# Patient Record
Sex: Male | Born: 2000 | Race: Black or African American | Hispanic: No | Marital: Single | Smoking: Never smoker
Health system: Southern US, Community
[De-identification: ages and names within clinical notes are randomized; demographics above are authoritative.]

## PROBLEM LIST (undated history)

## (undated) HISTORY — PX: TONSILLECTOMY: SUR1361

---
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
## 2022-01-01 ENCOUNTER — Ambulatory Visit: Payer: Medicaid Other | Admitting: Physical Therapy

## 2022-01-02 ENCOUNTER — Ambulatory Visit: Payer: Medicaid Other | Admitting: Physical Therapy

## 2022-01-03 ENCOUNTER — Ambulatory Visit: Payer: Medicaid Other | Admitting: Physical Therapy

## 2022-01-07 ENCOUNTER — Ambulatory Visit: Payer: Medicaid Other | Admitting: Physical Therapy

## 2022-01-08 ENCOUNTER — Ambulatory Visit: Payer: Medicaid Other | Admitting: Physical Therapy

## 2022-01-09 ENCOUNTER — Ambulatory Visit: Payer: Medicaid Other | Admitting: Physical Therapy

## 2022-01-10 ENCOUNTER — Encounter: Payer: Medicaid Other | Admitting: Physical Therapy

## 2022-01-14 ENCOUNTER — Ambulatory Visit: Payer: Medicaid Other | Admitting: Physical Therapy

## 2022-01-15 ENCOUNTER — Ambulatory Visit: Payer: Medicaid Other | Admitting: Physical Therapy

## 2022-01-16 ENCOUNTER — Ambulatory Visit: Payer: Medicaid Other | Admitting: Physical Therapy

## 2022-01-17 ENCOUNTER — Ambulatory Visit: Payer: Medicaid Other | Admitting: Physical Therapy

## 2022-01-21 ENCOUNTER — Ambulatory Visit: Payer: Medicaid Other | Admitting: Physical Therapy

## 2022-01-22 ENCOUNTER — Ambulatory Visit: Payer: Medicaid Other | Admitting: Physical Therapy

## 2022-01-23 ENCOUNTER — Ambulatory Visit: Payer: Medicaid Other | Admitting: Physical Therapy

## 2022-01-24 ENCOUNTER — Ambulatory Visit: Payer: Medicaid Other | Admitting: Physical Therapy

## 2022-01-28 ENCOUNTER — Ambulatory Visit: Payer: Medicaid Other | Admitting: Physical Therapy

## 2022-01-29 ENCOUNTER — Ambulatory Visit: Payer: Medicaid Other | Admitting: Orthopaedic Surgery

## 2022-01-29 ENCOUNTER — Ambulatory Visit: Payer: Medicaid Other | Admitting: Physical Therapy

## 2022-01-30 ENCOUNTER — Ambulatory Visit: Payer: Medicaid Other | Admitting: Physical Therapy

## 2022-01-31 ENCOUNTER — Ambulatory Visit: Payer: Medicaid Other | Admitting: Physical Therapy

## 2022-02-04 ENCOUNTER — Ambulatory Visit: Payer: Medicaid Other | Admitting: Physical Therapy

## 2022-02-05 ENCOUNTER — Ambulatory Visit: Payer: Medicaid Other | Admitting: Physical Therapy

## 2022-02-06 ENCOUNTER — Ambulatory Visit: Payer: Medicaid Other | Admitting: Physical Therapy

## 2022-02-07 ENCOUNTER — Ambulatory Visit: Payer: Medicaid Other | Admitting: Physical Therapy

## 2022-02-11 ENCOUNTER — Ambulatory Visit: Payer: Medicaid Other | Admitting: Physical Therapy

## 2022-02-12 ENCOUNTER — Ambulatory Visit: Payer: Medicaid Other | Admitting: Physical Therapy

## 2022-02-12 ENCOUNTER — Ambulatory Visit: Payer: Medicaid Other | Admitting: Orthopaedic Surgery

## 2022-02-13 ENCOUNTER — Ambulatory Visit: Payer: Medicaid Other | Admitting: Physical Therapy

## 2022-02-14 ENCOUNTER — Ambulatory Visit: Payer: Medicaid Other | Admitting: Physical Therapy

## 2022-05-22 ENCOUNTER — Ambulatory Visit (INDEPENDENT_AMBULATORY_CARE_PROVIDER_SITE_OTHER): Payer: Medicaid Other | Admitting: Orthopaedic Surgery

## 2022-05-22 ENCOUNTER — Ambulatory Visit (INDEPENDENT_AMBULATORY_CARE_PROVIDER_SITE_OTHER): Payer: Medicaid Other

## 2022-05-22 DIAGNOSIS — S72352D Displaced comminuted fracture of shaft of left femur, subsequent encounter for closed fracture with routine healing: Secondary | ICD-10-CM

## 2022-05-22 NOTE — Progress Notes (Signed)
   Post-Op Visit Note   Patient: Andre Castillo           Date of Birth: [birth year]           MRN: [id number] Visit Date: 05/22/2022 PCP: Pcp, No   Assessment & Plan:  Chief Complaint:  Chief Complaint  Patient presents with   Left Leg - Routine Post Op   Visit Diagnoses:  1. Closed displaced comminuted fracture of shaft of left femur with routine healing, subsequent encounter     Plan: Patient is a pleasant 21 year old who comes in today for follow-up of his left femur fracture.  He is approximately 8 and half months status post left retrograde femoral nail 09/07/2021.  He continues to have pain to the left hip and knee.  He is also complaining of limited range of motion of the left knee.  He notes that he stopped going to physical therapy back in early 2022-12-10 due to a death in the family.  He has not been working on his exercise program.  Examination of his left hip reveals a painless logroll.  Left knee exam shows range of motion from 0 to 95 degrees.  He does have tenderness to the medial knee just over the screw.  He is neurovascular intact distally.  At this point, his fracture has healed.  He would like to have the hardware removed specifically the distal interlock screws.  We will have Debbie call him to schedule this.  In the meantime, we will make referral to Dr. Steward Drone for left hip intra-articular cortisone injection.  Follow-Up Instructions: Return for post-op.   Orders:  Orders Placed This Encounter  Procedures   XR FEMUR MIN 2 VIEWS LEFT   No orders of the defined types were placed in this encounter.   Imaging: XR FEMUR MIN 2 VIEWS LEFT  Result Date: 05/22/2022 X-rays demonstrate a healed distal femur fracture.  No hardware complication.   PMFS History: Patient Active Problem List   Diagnosis Date Noted   Left leg pain 09/12/2021   Swelling of thigh    Closed displaced comminuted fracture of shaft of left femur (HCC) 09/07/2021   No past medical history on  file.  No family history on file.  Past Surgical History:  Procedure Laterality Date   FEMUR IM NAIL Left 09/07/2021   Procedure: LEFT INTRAMEDULLARY (IM) RETROGRADE FEMORAL NAILING;  Surgeon: Tarry Kos, MD;  Location: [facility] OR;  Service: Orthopedics;  Laterality: Left;   Social History   Occupational History   Not on file  Tobacco Use   Smoking status: Not on file   Smokeless tobacco: Not on file  Substance and Sexual Activity   Alcohol use: Not on file   Drug use: Not on file   Sexual activity: Not on file

## 2022-05-23 ENCOUNTER — Encounter: Payer: Self-pay | Admitting: Family Medicine

## 2022-06-11 ENCOUNTER — Other Ambulatory Visit: Payer: Self-pay | Admitting: Physician Assistant

## 2022-06-11 MED ORDER — HYDROCODONE-ACETAMINOPHEN 5-325 MG PO TABS: 1.0000 | ORAL_TABLET | Freq: Two times a day (BID) | ORAL | 0 refills | Status: DC | PRN

## 2022-06-11 MED ORDER — ONDANSETRON HCL 4 MG PO TABS: 4.0000 mg | ORAL_TABLET | Freq: Three times a day (TID) | ORAL | 0 refills | Status: DC | PRN

## 2022-06-20 ENCOUNTER — Other Ambulatory Visit: Payer: Self-pay | Admitting: Orthopaedic Surgery

## 2022-06-20 DIAGNOSIS — T8484XA Pain due to internal orthopedic prosthetic devices, implants and grafts, initial encounter: Secondary | ICD-10-CM | POA: Diagnosis not present

## 2022-06-20 MED ORDER — TRAMADOL HCL 50 MG PO TABS: 50.0000 mg | ORAL_TABLET | Freq: Every day | ORAL | 0 refills | Status: AC | PRN

## 2022-07-04 ENCOUNTER — Encounter: Payer: Medicaid Other | Admitting: Orthopaedic Surgery

## 2022-07-18 ENCOUNTER — Ambulatory Visit (INDEPENDENT_AMBULATORY_CARE_PROVIDER_SITE_OTHER): Payer: Medicaid Other | Admitting: Physician Assistant

## 2022-07-18 ENCOUNTER — Other Ambulatory Visit: Payer: Self-pay | Admitting: Physician Assistant

## 2022-07-18 DIAGNOSIS — Z9889 Other specified postprocedural states: Secondary | ICD-10-CM

## 2022-07-18 MED ORDER — DICLOFENAC SODIUM 75 MG PO TBEC: 75.0000 mg | DELAYED_RELEASE_TABLET | Freq: Two times a day (BID) | ORAL | 2 refills | Status: AC | PRN

## 2022-07-18 NOTE — Progress Notes (Signed)
   Post-Op Visit Note   Patient: Andre Castillo           Date of Birth: May 28, 2001           MRN: [id number] Visit Date: 07/18/2022 PCP: Pcp, No   Assessment & Plan:  Chief Complaint:  Chief Complaint  Patient presents with   Left Knee - Routine Post Op   Visit Diagnoses:  1. Status post hardware removal     Plan: Patient is an 21 year old who comes in today for his first postoperative visit which is 4 weeks status post removal of distal interlock screws from previous retrograde femoral nail.  He notes mild discomfort to the knee which is unchanged prior to the surgery.  He is not taking anything for pain.  Examination of his left knee reveals a small effusion.  Well-healed surgical incision with nylon sutures in place.  No evidence of infection or cellulitis.  Painless range of motion.  He is neurovascular intact distally.  Today, sutures removed and Steri-Strips applied.  We will continue to advance activity as tolerated.  I sent in diclofenac to take as needed.  Follow-up as needed.  Call with concerns or questions.  Follow-Up Instructions: Return if symptoms worsen or fail to improve.   Orders:  No orders of the defined types were placed in this encounter.  No orders of the defined types were placed in this encounter.   Imaging: No new imaging  PMFS History: Patient Active Problem List   Diagnosis Date Noted   Left leg pain 09/12/2021   Swelling of thigh    Closed displaced comminuted fracture of shaft of left femur ([place]) 09/07/2021   No past medical history on file.  No family history on file.  Past Surgical History:  Procedure Laterality Date   FEMUR IM NAIL Left 09/07/2021   Procedure: LEFT INTRAMEDULLARY (IM) RETROGRADE FEMORAL NAILING;  Surgeon: Leandrew Koyanagi, MD;  Location: [facility];  Service: Orthopedics;  Laterality: Left;   Social History   Occupational History   Not on file  Tobacco Use   Smoking status: Not on file   Smokeless tobacco: Not on file   Substance and Sexual Activity   Alcohol use: Not on file   Drug use: Not on file   Sexual activity: Not on file

## 2022-12-15 ENCOUNTER — Emergency Department (HOSPITAL_COMMUNITY)
Admission: EM | Admit: 2022-12-15 | Discharge: 2022-12-15 | Disposition: A | Discharge status: Home / Self Care | Payer: Medicaid Other | Attending: Emergency Medicine | Admitting: Emergency Medicine

## 2022-12-15 ENCOUNTER — Emergency Department (HOSPITAL_COMMUNITY): Payer: Medicaid Other

## 2022-12-15 DIAGNOSIS — M25552 Pain in left hip: Secondary | ICD-10-CM

## 2022-12-15 MED ORDER — IBUPROFEN 800 MG PO TABS
800.0000 mg | ORAL_TABLET | Freq: Once | ORAL | Status: AC
  Administered 2022-12-15: 800 mg via ORAL
  Filled 2022-12-15: qty 1

## 2022-12-15 MED ORDER — CYCLOBENZAPRINE HCL 10 MG PO TABS: 10.0000 mg | ORAL_TABLET | Freq: Two times a day (BID) | ORAL | 0 refills | Status: DC | PRN

## 2022-12-15 MED ORDER — CYCLOBENZAPRINE HCL 10 MG PO TABS: 10.0000 mg | ORAL_TABLET | Freq: Two times a day (BID) | ORAL | 0 refills | Status: AC | PRN

## 2022-12-15 MED ORDER — IBUPROFEN 600 MG PO TABS: 600.0000 mg | ORAL_TABLET | Freq: Four times a day (QID) | ORAL | 0 refills | Status: AC | PRN

## 2022-12-15 MED ORDER — IBUPROFEN 600 MG PO TABS: 600.0000 mg | ORAL_TABLET | Freq: Four times a day (QID) | ORAL | 0 refills | Status: DC | PRN
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
## 2022-12-15 NOTE — Discharge Instructions (Addendum)
You have been evaluated for your hip pain.  Fortunately x-ray did not show any new broken bone or loosening of the hardware.  Please follow-up closely with orthopedics for outpatient evaluation and management of your condition.  Take medication prescribed as needed for symptom control.
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
## 2022-12-19 ENCOUNTER — Encounter: Payer: Self-pay | Admitting: Orthopaedic Surgery

## 2022-12-19 ENCOUNTER — Telehealth: Payer: Self-pay | Admitting: Orthopaedic Surgery

## 2022-12-19 ENCOUNTER — Ambulatory Visit (INDEPENDENT_AMBULATORY_CARE_PROVIDER_SITE_OTHER): Payer: Medicaid Other | Admitting: Orthopaedic Surgery

## 2022-12-19 DIAGNOSIS — S72352D Displaced comminuted fracture of shaft of left femur, subsequent encounter for closed fracture with routine healing: Secondary | ICD-10-CM

## 2022-12-19 NOTE — Telephone Encounter (Signed)
Patient asking to have hardware removed from his leg. Please advise

## 2022-12-19 NOTE — Progress Notes (Signed)
Office Visit Note   Patient: Andre Castillo           Date of Birth: [birth year]           MRN: [id number] Visit Date: 12/19/2022              Requested by: No referring provider defined for this encounter. PCP: Pcp, No   Assessment & Plan: Visit Diagnoses:  1. Closed displaced comminuted fracture of shaft of left femur with routine healing, subsequent encounter     Plan: Impression is left lateral hip pain with contusion from the new fall.  Reassurance provided that there is no hardware complications that could explain his pain.  I explained the proximal interlocking screws go from anterior to posterior and not medial to lateral.  He also reports exercise intolerance and lack of energy.  I recommend that he see a primary care doctor for a full evaluation.  He also reports left knee stiffness which is consistent with expected postsurgical changes.  We have made a referral to physical therapy to hopefully help with strength and exercise tolerance.  I also explained that I am more than happy to take out all the hardware if he needs this for his mental wellbeing but I cautioned him that this will probably not improve the symptoms and will weaken the femur temporarily.  He will follow-up as needed.  Total face to face encounter time was greater than 25 minutes and over half of this time was spent in counseling and/or coordination of care.  Follow-Up Instructions: Return if symptoms worsen or fail to improve.   Orders:  Orders Placed This Encounter  Procedures   Ambulatory referral to Physical Therapy   No orders of the defined types were placed in this encounter.     Procedures: No procedures performed   Clinical Data: No additional findings.   Subjective: Chief Complaint  Patient presents with   Left Leg - Pain    HPI patient is a pleasant 22 year old who comes in today with left lateral hip pain.  He is status post retrograde femoral nail 09/07/2021 with subsequent  hardware removal to the distal interlock screws from this past fall.  He is doing well in regards to the knee but has had chronic discomfort to the lateral hip.  This became worse after sustaining a fall a few weeks ago.  He notes that he slipped on ice and fell in the left buttock and lateral hip.  The pain since the fall has slightly improved but it is still there.  Symptoms are worse when he is lying on the left side as well as after he has been walking a lot throughout the day at school.  He does not currently have any pain sitting on the table.  He also has relief with taking a muscle relaxer.  Review of Systems as detailed in HPI.  All others reviewed and are negative.   Objective: Vital Signs: There were no vitals taken for this visit.  Physical Exam well-developed well-nourished gentleman in no acute distress.  Alert and oriented x 3.  Ortho Exam left hip exam reveals painless logroll.  Moderate tenderness over the greater trochanter.  Painless range of motion of the knee.  He is neurovascular intact distally.  Specialty Comments:  No specialty comments available.  Imaging: No new imaging   PMFS History: Patient Active Problem List   Diagnosis Date Noted   Left leg pain 09/12/2021   Swelling of thigh  Closed displaced comminuted fracture of shaft of left femur ([place]) 09/07/2021   History reviewed. No pertinent past medical history.  No family history on file.  Past Surgical History:  Procedure Laterality Date   FEMUR IM NAIL Left 09/07/2021   Procedure: LEFT INTRAMEDULLARY (IM) RETROGRADE FEMORAL NAILING;  Surgeon: Leandrew Koyanagi, MD;  Location: [place];  Service: Orthopedics;  Laterality: Left;   Social History   Occupational History   Not on file  Tobacco Use   Smoking status: Not on file   Smokeless tobacco: Not on file  Substance and Sexual Activity   Alcohol use: Not on file   Drug use: Not on file   Sexual activity: Not on file

## 2022-12-20 NOTE — Telephone Encounter (Signed)
Ok, I'll let debbie know

## 2023-01-02 ENCOUNTER — Encounter (HOSPITAL_COMMUNITY): Payer: Self-pay | Admitting: Orthopaedic Surgery

## 2023-01-02 NOTE — Pre-Procedure Instructions (Signed)
PCP - Pt does not remember who it is Cardiologist - n/a  PPM/ICD - Denies Device Orders - n/a Rep Notified - n/a  Chest x-ray - n/a EKG - Denies Stress Test - Denies ECHO - Denies Cardiac Cath - Denies  CPAP - n/a  No DM  Blood Thinner Instructions: n/a Aspirin Instructions: n/a  ERAS Protcol - Clear liquids until 0430 morning of surgery  COVID TEST- n/a  Anesthesia review: No.  Patient verbally denies any shortness of breath, fever, cough and chest pain during phone call   -------------  SDW INSTRUCTIONS given:  Your procedure is scheduled on January 03, 2023.  Report to [facility] Main Entrance "A" at 5:30 A.M., and check in at the Admitting office.  Call this number if you have problems the morning of surgery:  [phone]   Remember:  Do not eat after midnight the night before your surgery  You may drink clear liquids until 4:30 AM the morning of your surgery.   Clear liquids allowed are: Water, Non-Citrus Juices (without pulp), Carbonated Beverages, Clear Tea, Black Coffee Only, and Gatorade    Take these medicines the morning of surgery with A SIP OF WATER:  NONE  As of today, STOP taking any Aspirin (unless otherwise instructed by your surgeon) Aleve, Naproxen, Ibuprofen, Motrin, Advil, Goody's, BC's, all herbal medications, fish oil, and all vitamins.                      Do not wear jewelry, make up, or nail polish            Do not wear lotions, powders, perfumes/colognes, or deodorant.            Do not shave 48 hours prior to surgery.  Men may shave face and neck.            Do not bring valuables to the hospital.            [facility] is not responsible for any belongings or valuables.  Do NOT Smoke (Tobacco/Vaping) 24 hours prior to your procedure If you use a CPAP at night, you may bring all equipment for your overnight stay.   Contacts, glasses, dentures or bridgework may not be worn into surgery.      For patients admitted to the hospital,  discharge time will be determined by your treatment team.   Patients discharged the day of surgery will not be allowed to drive home, and someone needs to stay with them for 24 hours.    Special instructions:   [place]- Preparing For Surgery  Before surgery, you can play an important role. Because skin is not sterile, your skin needs to be as free of germs as possible. You can reduce the number of germs on your skin by washing with CHG (chlorahexidine gluconate) Soap before surgery.  CHG is an antiseptic cleaner which kills germs and bonds with the skin to continue killing germs even after washing.    Oral Hygiene is also important to reduce your risk of infection.  Remember - BRUSH YOUR TEETH THE MORNING OF SURGERY WITH YOUR REGULAR TOOTHPASTE  Please do not use if you have an allergy to CHG or antibacterial soaps. If your skin becomes reddened/irritated stop using the CHG.  Do not shave (including legs and underarms) for at least 48 hours prior to first CHG shower. It is OK to shave your face.  Please follow these instructions carefully.   Shower the [employer]  BEFORE SURGERY and the MORNING OF SURGERY with DIAL Soap.   Pat yourself dry with a CLEAN TOWEL.  Wear CLEAN PAJAMAS to bed the night before surgery  Place CLEAN SHEETS on your bed the night of your first shower and DO NOT SLEEP WITH PETS.   Day of Surgery: Please shower morning of surgery  Wear Clean/Comfortable clothing the morning of surgery Do not apply any deodorants/lotions.   Remember to brush your teeth WITH YOUR REGULAR TOOTHPASTE.   Questions were answered. Patient verbalized understanding of instructions.

## 2023-01-02 NOTE — Anesthesia Preprocedure Evaluation (Addendum)
Anesthesia Evaluation  Patient identified by MRN, date of birth, ID band Patient awake    Reviewed: Allergy & Precautions, NPO status , Patient's Chart, lab work & pertinent test results  History of Anesthesia Complications Negative for: history of anesthetic complications  Airway Mallampati: I  TM Distance: >3 FB Neck ROM: Full    Dental  (+) Dental Advisory Given, Teeth Intact   Pulmonary Current Smoker and Patient abstained from smoking.   breath sounds clear to auscultation       Cardiovascular negative cardio ROS  Rhythm:Regular Rate:Normal     Neuro/Psych negative neurological ROS     GI/Hepatic negative GI ROS, Neg liver ROS,,,  Endo/Other  negative endocrine ROS    Renal/GU negative Renal ROS     Musculoskeletal   Abdominal   Peds  Hematology negative hematology ROS (+)   Anesthesia Other Findings   Reproductive/Obstetrics                              Anesthesia Physical Anesthesia Plan  ASA: 2  Anesthesia Plan: General   Post-op Pain Management: Tylenol PO (pre-op)*   Induction: Intravenous  PONV Risk Score and Plan: 2 and Ondansetron and Dexamethasone  Airway Management Planned: LMA  Additional Equipment:   Intra-op Plan:   Post-operative Plan:   Informed Consent: I have reviewed the patients History and Physical, chart, labs and discussed the procedure including the risks, benefits and alternatives for the proposed anesthesia with the patient or authorized representative who has indicated his/her understanding and acceptance.     Dental advisory given  Plan Discussed with: CRNA and Surgeon  Anesthesia Plan Comments:          Anesthesia Quick Evaluation

## 2023-01-03 ENCOUNTER — Encounter (HOSPITAL_COMMUNITY)
Admission: RE | Disposition: A | Discharge status: Home / Self Care | Payer: Self-pay | Source: Home / Self Care | Attending: Orthopaedic Surgery

## 2023-01-03 ENCOUNTER — Encounter (HOSPITAL_COMMUNITY): Payer: Self-pay | Admitting: Orthopaedic Surgery

## 2023-01-03 ENCOUNTER — Ambulatory Visit (HOSPITAL_COMMUNITY): Payer: Medicaid Other | Admitting: Anesthesiology

## 2023-01-03 ENCOUNTER — Ambulatory Visit (HOSPITAL_COMMUNITY): Payer: Medicaid Other

## 2023-01-03 ENCOUNTER — Other Ambulatory Visit: Payer: Self-pay

## 2023-01-03 ENCOUNTER — Ambulatory Visit (HOSPITAL_BASED_OUTPATIENT_CLINIC_OR_DEPARTMENT_OTHER): Payer: Medicaid Other | Admitting: Anesthesiology

## 2023-01-03 ENCOUNTER — Ambulatory Visit (HOSPITAL_COMMUNITY)
Admission: RE | Admit: 2023-01-03 | Discharge: 2023-01-03 | Disposition: A | Discharge status: Home / Self Care | Payer: Medicaid Other | Attending: Orthopaedic Surgery | Admitting: Orthopaedic Surgery

## 2023-01-03 DIAGNOSIS — T8484XA Pain due to internal orthopedic prosthetic devices, implants and grafts, initial encounter: Secondary | ICD-10-CM

## 2023-01-03 DIAGNOSIS — Z472 Encounter for removal of internal fixation device: Secondary | ICD-10-CM | POA: Insufficient documentation

## 2023-01-03 DIAGNOSIS — S72352D Displaced comminuted fracture of shaft of left femur, subsequent encounter for closed fracture with routine healing: Secondary | ICD-10-CM | POA: Diagnosis not present

## 2023-01-03 DIAGNOSIS — S72352A Displaced comminuted fracture of shaft of left femur, initial encounter for closed fracture: Secondary | ICD-10-CM | POA: Diagnosis present

## 2023-01-03 HISTORY — PX: HARDWARE REMOVAL: SHX979

## 2023-01-03 LAB — CBC
HCT: 42.7 % (ref 39.0–52.0)
Hemoglobin: 14.9 g/dL (ref 13.0–17.0)
MCH: 32 pg (ref 26.0–34.0)
MCHC: 34.9 g/dL (ref 30.0–36.0)
MCV: 91.8 fL (ref 80.0–100.0)
Platelets: 260 10*3/uL (ref 150–400)
RBC: 4.65 MIL/uL (ref 4.22–5.81)
RDW: 12.6 % (ref 11.5–15.5)
WBC: 7.7 10*3/uL (ref 4.0–10.5)
nRBC: 0 % (ref 0.0–0.2)

## 2023-01-03 SURGERY — REMOVAL, HARDWARE
Anesthesia: General | Site: Leg Upper | Laterality: Left

## 2023-01-03 MED ORDER — OXYCODONE HCL 5 MG/5ML PO SOLN: 5.0000 mg | Freq: Once | ORAL | Status: DC | PRN

## 2023-01-03 MED ORDER — PROMETHAZINE HCL 25 MG/ML IJ SOLN: 6.2500 mg | INTRAMUSCULAR | Status: DC | PRN

## 2023-01-03 MED ORDER — BUPIVACAINE HCL (PF) 0.25 % IJ SOLN
INTRAMUSCULAR | Status: AC
  Filled 2023-01-03: qty 20

## 2023-01-03 MED ORDER — FENTANYL CITRATE (PF) 250 MCG/5ML IJ SOLN
INTRAMUSCULAR | Status: DC | PRN
  Administered 2023-01-03 (×2): 50 ug via INTRAVENOUS
  Administered 2023-01-03: 150 ug via INTRAVENOUS

## 2023-01-03 MED ORDER — ORAL CARE MOUTH RINSE: 15.0000 mL | Freq: Once | OROMUCOSAL | Status: AC

## 2023-01-03 MED ORDER — ONDANSETRON HCL 4 MG PO TABS: 4.0000 mg | ORAL_TABLET | Freq: Three times a day (TID) | ORAL | 0 refills | Status: DC | PRN

## 2023-01-03 MED ORDER — BUPIVACAINE HCL (PF) 0.25 % IJ SOLN
INTRAMUSCULAR | Status: AC
  Filled 2023-01-03: qty 10

## 2023-01-03 MED ORDER — DEXAMETHASONE SODIUM PHOSPHATE 10 MG/ML IJ SOLN
INTRAMUSCULAR | Status: DC | PRN
  Administered 2023-01-03: 5 mg via INTRAVENOUS

## 2023-01-03 MED ORDER — DEXMEDETOMIDINE HCL IN NACL 80 MCG/20ML IV SOLN
INTRAVENOUS | Status: DC | PRN
  Administered 2023-01-03: 6 ug via BUCCAL
  Administered 2023-01-03: 8 ug via BUCCAL

## 2023-01-03 MED ORDER — HYDROMORPHONE HCL 1 MG/ML IJ SOLN: 0.2500 mg | INTRAMUSCULAR | Status: DC | PRN

## 2023-01-03 MED ORDER — FENTANYL CITRATE (PF) 250 MCG/5ML IJ SOLN
INTRAMUSCULAR | Status: AC
  Filled 2023-01-03: qty 5

## 2023-01-03 MED ORDER — HYDROMORPHONE HCL 1 MG/ML IJ SOLN
INTRAMUSCULAR | Status: AC
  Filled 2023-01-03: qty 0.5

## 2023-01-03 MED ORDER — OXYCODONE-ACETAMINOPHEN 5-325 MG PO TABS: 1.0000 | ORAL_TABLET | Freq: Two times a day (BID) | ORAL | 0 refills | Status: AC | PRN

## 2023-01-03 MED ORDER — AMISULPRIDE (ANTIEMETIC) 5 MG/2ML IV SOLN
INTRAVENOUS | Status: AC
  Filled 2023-01-03: qty 4

## 2023-01-03 MED ORDER — LACTATED RINGERS IV SOLN: INTRAVENOUS | Status: DC

## 2023-01-03 MED ORDER — PROPOFOL 10 MG/ML IV BOLUS
INTRAVENOUS | Status: AC
  Filled 2023-01-03: qty 20

## 2023-01-03 MED ORDER — CHLORHEXIDINE GLUCONATE 0.12 % MT SOLN
15.0000 mL | Freq: Once | OROMUCOSAL | Status: AC
  Administered 2023-01-03: 15 mL via OROMUCOSAL
  Filled 2023-01-03: qty 15

## 2023-01-03 MED ORDER — OXYCODONE HCL 5 MG PO TABS: 5.0000 mg | ORAL_TABLET | Freq: Once | ORAL | Status: DC | PRN

## 2023-01-03 MED ORDER — LIDOCAINE 2% (20 MG/ML) 5 ML SYRINGE
INTRAMUSCULAR | Status: DC | PRN
  Administered 2023-01-03: 60 mg via INTRAVENOUS

## 2023-01-03 MED ORDER — ACETAMINOPHEN 500 MG PO TABS
1000.0000 mg | ORAL_TABLET | Freq: Once | ORAL | Status: AC
  Administered 2023-01-03: 1000 mg via ORAL
  Filled 2023-01-03: qty 2

## 2023-01-03 MED ORDER — CEFAZOLIN SODIUM-DEXTROSE 2-4 GM/100ML-% IV SOLN
2.0000 g | INTRAVENOUS | Status: AC
  Administered 2023-01-03: 2 g via INTRAVENOUS
  Filled 2023-01-03: qty 100

## 2023-01-03 MED ORDER — AMISULPRIDE (ANTIEMETIC) 5 MG/2ML IV SOLN
10.0000 mg | Freq: Once | INTRAVENOUS | Status: AC
  Administered 2023-01-03: 10 mg via INTRAVENOUS

## 2023-01-03 MED ORDER — MIDAZOLAM HCL 2 MG/2ML IJ SOLN
INTRAMUSCULAR | Status: AC
  Filled 2023-01-03: qty 2

## 2023-01-03 MED ORDER — METHOCARBAMOL 750 MG PO TABS: 750.0000 mg | ORAL_TABLET | Freq: Two times a day (BID) | ORAL | 3 refills | Status: AC | PRN

## 2023-01-03 MED ORDER — MIDAZOLAM HCL 2 MG/2ML IJ SOLN
INTRAMUSCULAR | Status: DC | PRN
  Administered 2023-01-03: 2 mg via INTRAVENOUS

## 2023-01-03 MED ORDER — MIDAZOLAM HCL 2 MG/2ML IJ SOLN: 0.5000 mg | Freq: Once | INTRAMUSCULAR | Status: DC | PRN

## 2023-01-03 MED ORDER — HYDROMORPHONE HCL 1 MG/ML IJ SOLN
INTRAMUSCULAR | Status: DC | PRN
  Administered 2023-01-03 (×2): .25 mg via INTRAVENOUS

## 2023-01-03 MED ORDER — BUPIVACAINE HCL (PF) 0.25 % IJ SOLN
INTRAMUSCULAR | Status: DC | PRN
  Administered 2023-01-03: 30 mL

## 2023-01-03 MED ORDER — MEPERIDINE HCL 25 MG/ML IJ SOLN: 6.2500 mg | INTRAMUSCULAR | Status: DC | PRN

## 2023-01-03 MED ORDER — PROPOFOL 10 MG/ML IV BOLUS
INTRAVENOUS | Status: DC | PRN
  Administered 2023-01-03: 250 mg via INTRAVENOUS

## 2023-01-03 MED ORDER — 0.9 % SODIUM CHLORIDE (POUR BTL) OPTIME
TOPICAL | Status: DC | PRN
  Administered 2023-01-03: 1000 mL

## 2023-01-03 MED ORDER — ONDANSETRON HCL 4 MG/2ML IJ SOLN
INTRAMUSCULAR | Status: DC | PRN
  Administered 2023-01-03: 4 mg via INTRAVENOUS

## 2023-01-03 MED ORDER — KETOROLAC TROMETHAMINE 10 MG PO TABS: 10.0000 mg | ORAL_TABLET | Freq: Two times a day (BID) | ORAL | 0 refills | Status: AC | PRN

## 2023-01-03 SURGICAL SUPPLY — 70 items
ALCOHOL 70% 16 OZ (MISCELLANEOUS) ×1 IMPLANT
BAG COUNTER SPONGE SURGICOUNT (BAG) IMPLANT
BAG SPNG CNTER NS LX DISP (BAG)
BLADE CLIPPER SURG (BLADE) IMPLANT
BLADE SURG 15 STRL LF DISP TIS (BLADE) ×1 IMPLANT
BLADE SURG 15 STRL SS (BLADE) ×1
BNDG CMPR MED 10X6 ELC LF (GAUZE/BANDAGES/DRESSINGS) ×1
BNDG ELASTIC 6X10 VLCR STRL LF (GAUZE/BANDAGES/DRESSINGS) ×1 IMPLANT
BNDG GAUZE DERMACEA FLUFF 4 (GAUZE/BANDAGES/DRESSINGS) ×1 IMPLANT
BNDG GZE DERMACEA 4 6PLY (GAUZE/BANDAGES/DRESSINGS) ×1
COVER SURGICAL LIGHT HANDLE (MISCELLANEOUS) ×1 IMPLANT
CUFF TOURN SGL QUICK 34 (TOURNIQUET CUFF)
CUFF TOURN SGL QUICK 42 (TOURNIQUET CUFF) IMPLANT
CUFF TRNQT CYL 34X4.125X (TOURNIQUET CUFF) IMPLANT
DRAPE C-ARM 42X72 X-RAY (DRAPES) ×1 IMPLANT
DRAPE C-ARMOR (DRAPES) ×1 IMPLANT
DRAPE HALF SHEET 40X57 (DRAPES) ×2 IMPLANT
DRAPE IMP U-DRAPE 54X76 (DRAPES) ×2 IMPLANT
DRAPE INCISE IOBAN 66X45 STRL (DRAPES) ×1 IMPLANT
DRAPE U-SHAPE 47X51 STRL (DRAPES) ×1 IMPLANT
DRESSING MEPILEX FLEX 4X4 (GAUZE/BANDAGES/DRESSINGS) IMPLANT
DRSG MEPILEX FLEX 4X4 (GAUZE/BANDAGES/DRESSINGS) ×2
DRSG MEPILEX POST OP 4X8 (GAUZE/BANDAGES/DRESSINGS) IMPLANT
DURAPREP 26ML APPLICATOR (WOUND CARE) ×2 IMPLANT
ELECT REM PT RETURN 9FT ADLT (ELECTROSURGICAL) ×1
ELECTRODE REM PT RTRN 9FT ADLT (ELECTROSURGICAL) ×1 IMPLANT
EXTRACTOR THRD WINQ 5/16-24 (INSTRUMENTS) IMPLANT
GAUZE SPONGE 4X4 12PLY STRL (GAUZE/BANDAGES/DRESSINGS) IMPLANT
GAUZE XEROFORM 5X9 LF (GAUZE/BANDAGES/DRESSINGS) IMPLANT
GLOVE BIOGEL PI IND STRL 7.0 (GLOVE) ×2 IMPLANT
GLOVE BIOGEL PI IND STRL 7.5 (GLOVE) ×2 IMPLANT
GLOVE ECLIPSE 7.0 STRL STRAW (GLOVE) ×1 IMPLANT
GLOVE SKINSENSE STRL SZ7.5 (GLOVE) ×1 IMPLANT
GLOVE SURG SYN 7.5  E (GLOVE) ×2
GLOVE SURG SYN 7.5 E (GLOVE) ×2 IMPLANT
GLOVE SURG SYN 7.5 PF PI (GLOVE) ×2 IMPLANT
GLOVE SURG UNDER POLY LF SZ7 (GLOVE) ×19 IMPLANT
GLOVE SURG UNDER POLY LF SZ7.5 (GLOVE) ×2 IMPLANT
GOWN STRL REUS W/ TWL LRG LVL3 (GOWN DISPOSABLE) ×2 IMPLANT
GOWN STRL REUS W/TWL LRG LVL3 (GOWN DISPOSABLE) ×2
GOWN STRL SURGICAL XL XLNG (GOWN DISPOSABLE) ×1 IMPLANT
GUIDEPIN VERSANAIL DSP 3.2X444 (ORTHOPEDIC DISPOSABLE SUPPLIES) IMPLANT
KIT BASIN OR (CUSTOM PROCEDURE TRAY) ×1 IMPLANT
KIT TURNOVER KIT B (KITS) ×1 IMPLANT
MANIFOLD NEPTUNE II (INSTRUMENTS) ×1 IMPLANT
NAIL EXTRACTOR TAP (TAP) IMPLANT
NDL HYPO 25GX1X1/2 BEV (NEEDLE) ×1 IMPLANT
NEEDLE HYPO 25GX1X1/2 BEV (NEEDLE) ×1 IMPLANT
NS IRRIG 1000ML POUR BTL (IV SOLUTION) ×3 IMPLANT
PACK ORTHO EXTREMITY (CUSTOM PROCEDURE TRAY) ×1 IMPLANT
PACK UNIVERSAL I (CUSTOM PROCEDURE TRAY) ×1 IMPLANT
PAD ARMBOARD 7.5X6 YLW CONV (MISCELLANEOUS) ×2 IMPLANT
PADDING CAST COTTON 6X4 STRL (CAST SUPPLIES) IMPLANT
STAPLER SKIN 35 WIDE (STAPLE) IMPLANT
STAPLER VISISTAT 35W (STAPLE) IMPLANT
STOCKINETTE 6  STRL (DRAPES) ×1
STOCKINETTE 6 STRL (DRAPES) ×1 IMPLANT
SUT VIC AB 0 CT1 27 (SUTURE)
SUT VIC AB 0 CT1 27XBRD ANBCTR (SUTURE) IMPLANT
SUT VIC AB 0 CT1 36 (SUTURE) IMPLANT
SUT VIC AB 1 CTX 36 (SUTURE) ×2
SUT VIC AB 1 CTX36XBRD ANBCTR (SUTURE) IMPLANT
SUT VIC AB 2-0 CT1 27 (SUTURE) ×2
SUT VIC AB 2-0 CT1 TAPERPNT 27 (SUTURE) IMPLANT
SYR 20ML ECCENTRIC (SYRINGE) ×1 IMPLANT
TOWEL GREEN STERILE (TOWEL DISPOSABLE) ×1 IMPLANT
TOWEL GREEN STERILE FF (TOWEL DISPOSABLE) ×1 IMPLANT
TUBE CONNECTING 20X1/4 (TUBING) ×1 IMPLANT
WATER STERILE IRR 1000ML POUR (IV SOLUTION) ×1 IMPLANT
YANKAUER SUCT BULB TIP NO VENT (SUCTIONS) ×1 IMPLANT

## 2023-01-03 NOTE — Anesthesia Postprocedure Evaluation (Signed)
Anesthesia Post Note  Patient: Andre Castillo  Procedure(s) Performed: LEFT FEMUR REMOVAL HARDWARE (Left: Leg Upper)     Patient location during evaluation: PACU Anesthesia Type: General Level of consciousness: awake and alert, patient cooperative and oriented Pain management: pain level controlled Vital Signs Assessment: post-procedure vital signs reviewed and stable Respiratory status: spontaneous breathing, nonlabored ventilation and respiratory function stable Cardiovascular status: blood pressure returned to baseline and stable Postop Assessment: no apparent nausea or vomiting (nausea improved) Anesthetic complications: no   No notable events documented.  Last Vitals:  Vitals:   01/03/23 0930 01/03/23 0945  BP: 98/73 106/74  Pulse: (!) 59 61  Resp: 11 12  Temp:  (!) 36.4 C  SpO2: 100% 97%    Last Pain:  Vitals:   01/03/23 0900  TempSrc:   PainSc: Asleep                 Trinity Haun,E. Mylah Baynes

## 2023-01-03 NOTE — Anesthesia Procedure Notes (Signed)
Procedure Name: LMA Insertion Date/Time: 01/03/2023 7:39 AM  Performed by: Elvin So, CRNAPre-anesthesia Checklist: Patient identified, Emergency Drugs available, Suction available and Patient being monitored Patient Re-evaluated:Patient Re-evaluated prior to induction Oxygen Delivery Method: Circle System Utilized Preoxygenation: Pre-oxygenation with 100% oxygen Induction Type: IV induction Ventilation: Mask ventilation without difficulty LMA: LMA inserted LMA Size: 4.0 Number of attempts: 1 Airway Equipment and Method: Bite block Placement Confirmation: positive ETCO2 Tube secured with: Tape Dental Injury: Teeth and Oropharynx as per pre-operative assessment

## 2023-01-03 NOTE — H&P (Signed)
PREOPERATIVE H&P  Chief Complaint: left femur removal hardware  HPI: Andre Castillo is a 22 y.o. male who presents for surgical treatment of left femur removal hardware.  He denies any changes in medical history.  History reviewed. No pertinent past medical history. Past Surgical History:  Procedure Laterality Date   FEMUR IM NAIL Left 09/07/2021   Procedure: LEFT INTRAMEDULLARY (IM) RETROGRADE FEMORAL NAILING;  Surgeon: Leandrew Koyanagi, MD;  Location: [place];  Service: Orthopedics;  Laterality: Left;   TONSILLECTOMY     as a child   Social History   Socioeconomic History   Marital status: Single    Spouse name: Not on file   Number of children: Not on file   Years of education: Not on file   Highest education level: Not on file  Occupational History   Not on file  Tobacco Use   Smoking status: Never   Smokeless tobacco: Never  Vaping Use   Vaping Use: Never used  Substance and Sexual Activity   Alcohol use: Never   Drug use: Never   Sexual activity: Not Currently  Other Topics Concern   Not on file  Social History Narrative   Not on file   Social Determinants of Health   Financial Resource Strain: Not on file  Food Insecurity: Not on file  Transportation Needs: Not on file  Physical Activity: Not on file  Stress: Not on file  Social Connections: Not on file   History reviewed. No pertinent family history. Allergies  Allergen Reactions   Amoxicillin     Hives   Zithromax [Azithromycin] Other (See Comments)    Child hood   Prior to Admission medications   Medication Sig Start Date End Date Taking? Authorizing Provider  cyclobenzaprine (FLEXERIL) 10 MG tablet Take 1 tablet (10 mg total) by mouth 2 (two) times daily as needed for muscle spasms. Patient not taking: Reported on 01/01/2023 12/15/22   Domenic Moras, PA-C  diclofenac (VOLTAREN) 75 MG EC tablet Take 1 tablet (75 mg total) by mouth 2 (two) times daily as needed. Patient not taking: Reported on 01/01/2023  07/18/22   Aundra Dubin, PA-C  ibuprofen (ADVIL) 600 MG tablet Take 1 tablet (600 mg total) by mouth every 6 (six) hours as needed. Patient not taking: Reported on 01/01/2023 12/15/22   Domenic Moras, PA-C  ondansetron (ZOFRAN) 4 MG tablet Take 1 tablet (4 mg total) by mouth every 8 (eight) hours as needed for nausea or vomiting. Patient not taking: Reported on 01/01/2023 06/11/22   Aundra Dubin, PA-C  traMADol (ULTRAM) 50 MG tablet Take 1-2 tablets (50-100 mg total) by mouth daily as needed. Patient not taking: Reported on 01/01/2023 06/20/22   Leandrew Koyanagi, MD  Vitamin D, Ergocalciferol, (DRISDOL) 1.25 MG (50000 UNIT) CAPS capsule Take 1 capsule (50,000 Units total) by mouth every 7 (seven) days. Patient not taking: Reported on 01/01/2023 11/30/21   Leandrew Koyanagi, MD     Positive ROS: All other systems have been reviewed and were otherwise negative with the exception of those mentioned in the HPI and as above.  Physical Exam: General: Alert, no acute distress Cardiovascular: No pedal edema Respiratory: No cyanosis, no use of accessory musculature GI: abdomen soft Skin: No lesions in the area of chief complaint Neurologic: Sensation intact distally Psychiatric: Patient is competent for consent with normal mood and affect Lymphatic: no lymphedema  MUSCULOSKELETAL: exam stable  Assessment: left femur removal hardware  Plan: Plan for Procedure(s): LEFT  FEMUR REMOVAL HARDWARE  The risks benefits and alternatives were discussed with the patient including but not limited to the risks of nonoperative treatment, versus surgical intervention including infection, bleeding, nerve injury,  blood clots, cardiopulmonary complications, morbidity, mortality, among others, and they were willing to proceed.   Eduard Roux, MD 01/03/2023 6:16 AM

## 2023-01-03 NOTE — Discharge Instructions (Signed)
   Postoperative instructions:  Weightbearing instructions: as tolerated  Keep your dressing and/or splint clean and dry at all times.  You can remove your dressing on post-operative day #3 and change with a dry/sterile dressing or Band-Aids as needed thereafter.    Incision instructions:  Do not soak your incision for 3 weeks after surgery.  If the incision gets wet, pat dry and do not scrub the incision.  Pain control:  You have been given a prescription to be taken as directed for post-operative pain control.  In addition, elevate the operative extremity above the heart at all times to prevent swelling and throbbing pain.  Take over-the-counter Colace, 100mg by mouth twice a day while taking narcotic pain medications to help prevent constipation.  Follow up appointments: 1) 14 days for suture removal and wound check. 2) Dr. Edi Gorniak as scheduled.   -------------------------------------------------------------------------------------------------------------  After Surgery Pain Control:  After your surgery, post-surgical discomfort or pain is likely. This discomfort can last several days to a few weeks. At certain times of the day your discomfort may be more intense.  Did you receive a nerve block?  A nerve block can provide pain relief for one hour to two days after your surgery. As long as the nerve block is working, you will experience little or no sensation in the area the surgeon operated on.  As the nerve block wears off, you will begin to experience pain or discomfort. It is very important that you begin taking your prescribed pain medication before the nerve block fully wears off. Treating your pain at the first sign of the block wearing off will ensure your pain is better controlled and more tolerable when full-sensation returns. Do not wait until the pain is intolerable, as the medicine will be less effective. It is better to treat pain in advance than to try and catch up.  General  Anesthesia:  If you did not receive a nerve block during your surgery, you will need to start taking your pain medication shortly after your surgery and should continue to do so as prescribed by your surgeon.  Pain Medication:  Most commonly we prescribe Vicodin and Percocet for post-operative pain. Both of these medications contain a combination of acetaminophen (Tylenol) and a narcotic to help control pain.   It takes between 30 and 45 minutes before pain medication starts to work. It is important to take your medication before your pain level gets too intense.   Nausea is a common side effect of many pain medications. You will want to eat something before taking your pain medicine to help prevent nausea.   If you are taking a prescription pain medication that contains acetaminophen, we recommend that you do not take additional over the counter acetaminophen (Tylenol).  Other pain relieving options:   Using a cold pack to ice the affected area a few times a day (15 to 20 minutes at a time) can help to relieve pain, reduce swelling and bruising.   Elevation of the affected area can also help to reduce pain and swelling.  

## 2023-01-03 NOTE — Op Note (Signed)
   Date of Surgery: 01/03/2023  INDICATIONS: Andre Castillo is a 22 y.o.-year-old male with symptomatic hardware of left femur and hip.  The patient did consent to the procedure after discussion of the risks and benefits.  PREOPERATIVE DIAGNOSIS: Symptomatic hardware of left femur and hip  POSTOPERATIVE DIAGNOSIS: Same.  PROCEDURE:  Removal of left femoral retrograde nail through infrapatellar incision Removal of proximal interlock screws x 2 through separate incision of the anterior thigh  SURGEON: N. Eduard Roux, M.D.  ASSIST: Andre Castillo, Andre Castillo; necessary for the timely completion of procedure and due to complexity of procedure.  ANESTHESIA:  general  IV FLUIDS AND URINE: See anesthesia.  ESTIMATED BLOOD LOSS: 50 mL.  IMPLANTS: None  DRAINS: None  COMPLICATIONS: see description of procedure.  DESCRIPTION OF PROCEDURE: The patient was brought to the operating room.  The patient had been signed prior to the procedure and this was documented. The patient had the anesthesia placed by the anesthesiologist.  A time-out was performed to confirm that this was the correct patient, site, side and location. The patient did receive antibiotics prior to the incision and was re-dosed during the procedure as needed at indicated intervals.  The patient had the operative extremity prepped and draped in the standard surgical fashion.    I made an incision through the previous surgical scar over the patellar tendon.  The patella tendon was divided in line with its fibers with a 15 blade.  The fat pad was removed.  It was a guidepin was inserted up the canal of the nail.  An opening reamer was then used to clear the bone away from the end of the nail.  I first tried using the standard threaded guide but I was unsuccessful in engaging the threads therefore we used a wing Quist nail removal set which I was able to easily engage the threads of the femoral nail.  I then made a separate incision over the  proximal anterior thigh to remove the interlocking screws.  Fluoroscopic guidance was used to find the screw heads.  Finger palpation was also used to confirm the location.  The screwdriver was then gently tamped onto the screw head to engage it.  The 2 screws were then backed out by hand without any difficulty.  We then attached the device to the end of the guide and back slapped the nail out of the canal without any difficulty.  The surgical sites were thoroughly irrigated and closed in a layered fashion using 0 Vicryl, 2-0 Vicryl, staples.  Sterile dressings were applied.  Patient tolerated procedure well no many complications.  Andre Castillo was necessary for opening, closing, retracting, limb positioning and overall facilitation and timely completion of the procedure.  POSTOPERATIVE PLAN: Patient will be discharged home.  He can be weight-bear as tolerated.  He will follow-up in the office in 2 weeks for staple removal.  Pain medications have been sent to his pharmacy.  Azucena Cecil, MD 8:39 AM

## 2023-01-03 NOTE — Transfer of Care (Signed)
Immediate Anesthesia Transfer of Care Note  Patient: Andre Castillo  Procedure(s) Performed: LEFT FEMUR REMOVAL HARDWARE (Left: Leg Upper)  Patient Location: PACU  Anesthesia Type:General  Level of Consciousness: sedated and drowsy  Airway & Oxygen Therapy: Patient Spontanous Breathing and Patient connected to face mask oxygen  Post-op Assessment: Report given to RN, Post -op Vital signs reviewed and stable, and Patient moving all extremities  Post vital signs: Reviewed and stable  Last Vitals:  Vitals Value Taken Time  BP 92/54 01/03/23 0900  Temp    Pulse 65 01/03/23 0900  Resp 11 01/03/23 0900  SpO2 100 % 01/03/23 0900  Vitals shown include unvalidated device data.  Last Pain:  Vitals:   01/03/23 0622  TempSrc:   PainSc: 0-No pain         Complications: No notable events documented.

## 2023-01-04 ENCOUNTER — Encounter (HOSPITAL_COMMUNITY): Payer: Self-pay | Admitting: Orthopaedic Surgery

## 2023-01-17 ENCOUNTER — Encounter: Payer: Self-pay | Admitting: Orthopaedic Surgery

## 2023-01-17 ENCOUNTER — Ambulatory Visit (INDEPENDENT_AMBULATORY_CARE_PROVIDER_SITE_OTHER): Payer: Medicaid Other | Admitting: Physician Assistant

## 2023-01-17 DIAGNOSIS — T8484XA Pain due to internal orthopedic prosthetic devices, implants and grafts, initial encounter: Secondary | ICD-10-CM

## 2023-01-17 NOTE — Progress Notes (Signed)
   Post-Op Visit Note   Patient: Andre Castillo           Date of Birth: 11-Feb-2001           MRN: [id number] Visit Date: 01/17/2023 PCP: Liliane Bade, MD   Assessment & Plan:  Chief Complaint:  Chief Complaint  Patient presents with   Left Leg - Follow-up    Hardware removal of femur 01/03/2023   Visit Diagnoses:  1. Painful orthopaedic hardware [facility])     Plan: Patient is a pleasant 22 year old who comes in today 2 weeks status post removal left femoral retrograde nail and interlock screws, date of surgery 01/03/2023.  He has been doing okay but has been in pain.  Examination of his left knee reveals a small effusion.  He does have a fully healed surgical scar with staples intact.  He is neurovascularly intact distally.  Calf is soft nontender.  Today, staples were removed and Steri-Strips applied.  We will have him start with physical therapy for range of motion.  Referral has been made.  He will follow-up in 4 weeks for recheck.  Call with concerns or questions.  Follow-Up Instructions: Return in about 4 weeks (around 02/14/2023).   Orders:  Orders Placed This Encounter  Procedures   Ambulatory referral to Physical Therapy   No orders of the defined types were placed in this encounter.   Imaging: No new imaging  PMFS History: Patient Active Problem List   Diagnosis Date Noted   Painful orthopaedic hardware ([place]) 01/03/2023   Left leg pain 09/12/2021   Swelling of thigh    Closed displaced comminuted fracture of shaft of left femur ([place]) 09/07/2021   History reviewed. No pertinent past medical history.  No family history on file.  Past Surgical History:  Procedure Laterality Date   FEMUR IM NAIL Left 09/07/2021   Procedure: LEFT INTRAMEDULLARY (IM) RETROGRADE FEMORAL NAILING;  Surgeon: Leandrew Koyanagi, MD;  Location: [place];  Service: Orthopedics;  Laterality: Left;   HARDWARE REMOVAL Left 01/03/2023   Procedure: LEFT FEMUR REMOVAL HARDWARE;  Surgeon: Leandrew Koyanagi, MD;   Location: [place];  Service: Orthopedics;  Laterality: Left;   TONSILLECTOMY     as a child   Social History   Occupational History   Not on file  Tobacco Use   Smoking status: Never   Smokeless tobacco: Never  Vaping Use   Vaping Use: Never used  Substance and Sexual Activity   Alcohol use: Never   Drug use: Never   Sexual activity: Not Currently

## 2023-02-10 ENCOUNTER — Ambulatory Visit: Payer: Medicaid Other | Attending: Physician Assistant | Admitting: Physical Therapy
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
## 2023-09-26 ENCOUNTER — Encounter (HOSPITAL_BASED_OUTPATIENT_CLINIC_OR_DEPARTMENT_OTHER): Payer: Self-pay | Admitting: Emergency Medicine

## 2023-09-26 ENCOUNTER — Other Ambulatory Visit: Payer: Self-pay

## 2023-09-26 ENCOUNTER — Emergency Department (HOSPITAL_BASED_OUTPATIENT_CLINIC_OR_DEPARTMENT_OTHER)
Admission: EM | Admit: 2023-09-26 | Discharge: 2023-09-26 | Disposition: A | Discharge status: Home / Self Care | Payer: Medicaid Other | Attending: Emergency Medicine | Admitting: Emergency Medicine

## 2023-09-26 DIAGNOSIS — D72829 Elevated white blood cell count, unspecified: Secondary | ICD-10-CM | POA: Insufficient documentation

## 2023-09-26 DIAGNOSIS — L539 Erythematous condition, unspecified: Secondary | ICD-10-CM | POA: Insufficient documentation

## 2023-09-26 DIAGNOSIS — R112 Nausea with vomiting, unspecified: Secondary | ICD-10-CM | POA: Insufficient documentation

## 2023-09-26 DIAGNOSIS — R6883 Chills (without fever): Secondary | ICD-10-CM | POA: Insufficient documentation

## 2023-09-26 DIAGNOSIS — J029 Acute pharyngitis, unspecified: Secondary | ICD-10-CM | POA: Diagnosis not present

## 2023-09-26 LAB — COMPREHENSIVE METABOLIC PANEL
ALT: 15 U/L (ref 0–44)
AST: 23 U/L (ref 15–41)
Albumin: 4.7 g/dL (ref 3.5–5.0)
Alkaline Phosphatase: 64 U/L (ref 38–126)
Anion gap: 15 (ref 5–15)
BUN: 12 mg/dL (ref 6–20)
CO2: 23 mmol/L (ref 22–32)
Calcium: 9.5 mg/dL (ref 8.9–10.3)
Chloride: 102 mmol/L (ref 98–111)
Creatinine, Ser: 0.87 mg/dL (ref 0.61–1.24)
GFR, Estimated: >60 mL/min (ref >60)
Glucose, Bld: 80 mg/dL (ref 70–99)
Potassium: 3.7 mmol/L (ref 3.5–5.1)
Sodium: 140 mmol/L (ref 135–145)
Total Bilirubin: 0.4 mg/dL (ref <1.2)
Total Protein: 7.7 g/dL (ref 6.5–8.1)

## 2023-09-26 LAB — CBC
HCT: 45.7 % (ref 39.0–52.0)
Hemoglobin: 15.8 g/dL (ref 13.0–17.0)
MCH: 31.4 pg (ref 26.0–34.0)
MCHC: 34.6 g/dL (ref 30.0–36.0)
MCV: 90.9 fL (ref 80.0–100.0)
Platelets: 268 10*3/uL (ref 150–400)
RBC: 5.03 MIL/uL (ref 4.22–5.81)
RDW: 12.7 % (ref 11.5–15.5)
WBC: 16.7 10*3/uL — ABNORMAL HIGH (ref 4.0–10.5)
nRBC: 0 % (ref 0.0–0.2)

## 2023-09-26 LAB — GROUP A STREP BY PCR: Group A Strep by PCR: NOT DETECTED

## 2023-09-26 LAB — LIPASE, BLOOD: Lipase: 11 U/L (ref 11–51)
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
## 2023-09-26 MED ORDER — ONDANSETRON HCL 4 MG/2ML IJ SOLN
4.0000 mg | Freq: Once | INTRAMUSCULAR | Status: AC
  Administered 2023-09-26: 4 mg via INTRAVENOUS
  Filled 2023-09-26: qty 2

## 2023-09-26 MED ORDER — ONDANSETRON HCL 4 MG PO TABS: 4.0000 mg | ORAL_TABLET | Freq: Four times a day (QID) | ORAL | 0 refills | Status: AC

## 2023-09-26 MED ORDER — ACETAMINOPHEN 325 MG PO TABS
650.0000 mg | ORAL_TABLET | Freq: Once | ORAL | Status: AC
  Administered 2023-09-26: 650 mg via ORAL
  Filled 2023-09-26: qty 2

## 2023-09-26 NOTE — Discharge Instructions (Addendum)
Today you were seen for nausea and vomiting.  Please pick up your medication and take as needed.  You may also alternate taking Tylenol Motrin as needed for pain.  Please do not take Motrin for greater than 5 days neurosis may cause rebound headaches thank you for letting us treat you today. After performing a physical exam and reviewing your labs, I feel you are safe to go home. Please follow up with your PCP in the next several days and provide them with your records from this visit. Return to the Emergency Room if pain becomes severe or symptoms worsen.
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
## 2023-09-26 NOTE — ED Notes (Signed)
Still waiting on urine sample

## 2023-09-26 NOTE — ED Triage Notes (Addendum)
Vomiting today with 2 episodes with bright red blood. Also c/o back pain

## 2024-08-04 ENCOUNTER — Other Ambulatory Visit: Payer: Self-pay

## 2024-08-04 ENCOUNTER — Ambulatory Visit
Admission: RE | Admit: 2024-08-04 | Discharge: 2024-08-04 | Disposition: A | Discharge status: Home / Self Care | Attending: Physician Assistant | Admitting: Physician Assistant

## 2024-08-04 VITALS — BP 104/64 | HR 74 | Temp 98.0°F | Resp 17 | Ht 73.0 in | Wt 150.0 lb

## 2024-08-04 DIAGNOSIS — Z113 Encounter for screening for infections with a predominantly sexual mode of transmission: Secondary | ICD-10-CM | POA: Insufficient documentation
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
## 2024-08-04 NOTE — Discharge Instructions (Addendum)
 You were seen today for concerns for STD exposure. We have completed a cytology swab to check for gonorrhea, chlamydia, and trichomonas. We will keep you updated on these results once they are available and if any medications are indicated by this test results they will be sent in to the pharmacy on file or you will receive a call to set up an appointment for an injection here at urgent care. Please refrain from sexual activity until your test results are negative or until you have completed an appropriate medication regimen.  It is recommended that you use a condom or another barrier method to help prevent STD transmission going forward.  Please make sure that you are communicating your test results to your partners especially if there are any positive so that they can get requisite testing for themselves.
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
## 2024-08-06 LAB — CYTOLOGY, (ORAL, ANAL, URETHRAL) ANCILLARY ONLY
Chlamydia: POSITIVE — AB
Comment: NEGATIVE
Comment: NEGATIVE
Comment: NORMAL
Neisseria Gonorrhea: NEGATIVE
Trichomonas: NEGATIVE

## 2024-08-09 ENCOUNTER — Ambulatory Visit (HOSPITAL_COMMUNITY): Payer: Self-pay

## 2024-08-09 MED ORDER — DOXYCYCLINE HYCLATE 100 MG PO TABS: 100.0000 mg | ORAL_TABLET | Freq: Two times a day (BID) | ORAL | 0 refills | Status: AC

## 2024-08-10 NOTE — Telephone Encounter (Signed)
 Pt returned call.  Verified identity using two identifiers.  Provided positive result.  Reviewed safe sex practices, notifying partners, and refraining from sexual activities for 7 days from time of treatment.  Patient verified understanding, all questions answered.

## 2024-08-18 ENCOUNTER — Encounter (HOSPITAL_COMMUNITY): Payer: Self-pay
